# Patient Record
Sex: Male | Born: 1960 | Race: Black or African American | Hispanic: No | Marital: Single | Smoking: Current every day smoker
Health system: Southern US, Community
[De-identification: ages and names within clinical notes are randomized; demographics above are authoritative.]

## PROBLEM LIST (undated history)

## (undated) HISTORY — PX: APPENDECTOMY: SHX54

---

## 2003-05-11 ENCOUNTER — Emergency Department (HOSPITAL_COMMUNITY): Admission: EM | Admit: 2003-05-11 | Discharge: 2003-05-12 | Payer: Self-pay | Admitting: Emergency Medicine

## 2004-03-31 ENCOUNTER — Emergency Department (HOSPITAL_COMMUNITY): Admission: EM | Admit: 2004-03-31 | Discharge: 2004-03-31 | Payer: Self-pay | Admitting: *Deleted

## 2004-06-04 ENCOUNTER — Ambulatory Visit (HOSPITAL_COMMUNITY): Admission: RE | Admit: 2004-06-04 | Discharge: 2004-06-04 | Payer: Self-pay | Admitting: *Deleted

## 2004-10-30 ENCOUNTER — Ambulatory Visit (HOSPITAL_COMMUNITY): Admission: RE | Admit: 2004-10-30 | Discharge: 2004-10-30 | Payer: Self-pay | Admitting: *Deleted

## 2005-03-27 ENCOUNTER — Emergency Department (HOSPITAL_COMMUNITY): Admission: EM | Admit: 2005-03-27 | Discharge: 2005-03-27 | Payer: Self-pay | Admitting: Emergency Medicine

## 2006-05-19 ENCOUNTER — Emergency Department (HOSPITAL_COMMUNITY): Admission: EM | Admit: 2006-05-19 | Discharge: 2006-05-19 | Payer: Self-pay | Admitting: Emergency Medicine

## 2010-09-12 ENCOUNTER — Encounter: Payer: Self-pay | Admitting: *Deleted

## 2010-10-16 ENCOUNTER — Emergency Department (HOSPITAL_COMMUNITY): Payer: BC Managed Care – PPO

## 2010-10-16 ENCOUNTER — Emergency Department (HOSPITAL_COMMUNITY)
Admission: EM | Admit: 2010-10-16 | Discharge: 2010-10-16 | Disposition: A | Discharge status: Home / Self Care | Payer: BC Managed Care – PPO | Attending: Emergency Medicine | Admitting: Emergency Medicine

## 2010-10-16 DIAGNOSIS — F17200 Nicotine dependence, unspecified, uncomplicated: Secondary | ICD-10-CM | POA: Insufficient documentation

## 2010-10-16 DIAGNOSIS — M542 Cervicalgia: Secondary | ICD-10-CM | POA: Insufficient documentation

## 2010-10-16 DIAGNOSIS — M62838 Other muscle spasm: Secondary | ICD-10-CM | POA: Insufficient documentation

## 2010-10-16 DIAGNOSIS — S199XXA Unspecified injury of neck, initial encounter: Secondary | ICD-10-CM | POA: Insufficient documentation

## 2010-10-16 DIAGNOSIS — W219XXA Striking against or struck by unspecified sports equipment, initial encounter: Secondary | ICD-10-CM | POA: Insufficient documentation

## 2010-10-16 DIAGNOSIS — M25519 Pain in unspecified shoulder: Secondary | ICD-10-CM | POA: Insufficient documentation

## 2010-10-16 DIAGNOSIS — S0993XA Unspecified injury of face, initial encounter: Secondary | ICD-10-CM | POA: Insufficient documentation

## 2010-10-16 DIAGNOSIS — M47812 Spondylosis without myelopathy or radiculopathy, cervical region: Secondary | ICD-10-CM | POA: Insufficient documentation

## 2010-10-16 DIAGNOSIS — Y929 Unspecified place or not applicable: Secondary | ICD-10-CM | POA: Insufficient documentation

## 2010-10-16 DIAGNOSIS — Y9367 Activity, basketball: Secondary | ICD-10-CM | POA: Insufficient documentation

## 2012-07-23 ENCOUNTER — Emergency Department (HOSPITAL_COMMUNITY)
Admission: EM | Admit: 2012-07-23 | Discharge: 2012-07-23 | Discharge status: Against Medical Advice | Payer: Self-pay | Attending: Emergency Medicine | Admitting: Emergency Medicine

## 2012-07-23 ENCOUNTER — Encounter (HOSPITAL_COMMUNITY): Payer: Self-pay | Admitting: *Deleted

## 2012-07-23 ENCOUNTER — Emergency Department (INDEPENDENT_AMBULATORY_CARE_PROVIDER_SITE_OTHER)
Admission: EM | Admit: 2012-07-23 | Discharge: 2012-07-23 | Disposition: A | Discharge status: Nursing Home (Includes ICF) | Payer: Self-pay | Source: Home / Self Care | Attending: Family Medicine | Admitting: Family Medicine

## 2012-07-23 ENCOUNTER — Encounter (HOSPITAL_COMMUNITY): Payer: Self-pay | Admitting: Emergency Medicine

## 2012-07-23 DIAGNOSIS — X58XXXA Exposure to other specified factors, initial encounter: Secondary | ICD-10-CM | POA: Insufficient documentation

## 2012-07-23 DIAGNOSIS — R410 Disorientation, unspecified: Secondary | ICD-10-CM

## 2012-07-23 DIAGNOSIS — M545 Low back pain: Secondary | ICD-10-CM

## 2012-07-23 DIAGNOSIS — Y929 Unspecified place or not applicable: Secondary | ICD-10-CM | POA: Insufficient documentation

## 2012-07-23 DIAGNOSIS — Y939 Activity, unspecified: Secondary | ICD-10-CM | POA: Insufficient documentation

## 2012-07-23 DIAGNOSIS — S0993XA Unspecified injury of face, initial encounter: Secondary | ICD-10-CM | POA: Insufficient documentation

## 2012-07-23 DIAGNOSIS — F29 Unspecified psychosis not due to a substance or known physiological condition: Secondary | ICD-10-CM

## 2012-07-23 DIAGNOSIS — IMO0002 Reserved for concepts with insufficient information to code with codable children: Secondary | ICD-10-CM | POA: Insufficient documentation

## 2012-07-23 DIAGNOSIS — S0990XA Unspecified injury of head, initial encounter: Secondary | ICD-10-CM

## 2012-07-23 NOTE — ED Notes (Signed)
Patient left AMA immediately after being informed that he would need to wait for the physician to assess him prior to any pain medications being given. Patient refused teaching and refused to sign AMA paperwork

## 2012-07-23 NOTE — ED Notes (Signed)
Pt reports falling this weekend and having lower back and neck pain. Ambulatory at triage.

## 2012-07-23 NOTE — ED Notes (Signed)
Pt wheeled back into lobby in a wheelchair, pt and friend then became angry that friend was not allowed to come into triage area with him. Friend states that pt is disoriented and unable to answer questions. Pt answered all triage questions appropriately and did not seem confused. Informed pt that our policy is no visitors in triage, they became upset and left after being triaged.
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
## 2012-07-23 NOTE — ED Provider Notes (Signed)
History     CSN: [id number]  Arrival date & time 07/23/12  1409   None     Chief Complaint  Patient presents with  . Back Pain    fall on 11/29 hit back and head.    (Consider location/radiation/quality/duration/timing/severity/associated sxs/prior treatment) HPI Comments: Pt slipped and fell on marble floor [place], hitting lower back and head.  Denies LOC, but brother reports pt was confused the night of injury and is still having confusion/memory problems.  Pt here because back pain is severe; brother is concerned with pt's behavior ("he's not right").  Pt was [place] in ER [place] of state; pt thinks only received xrays, head was not checked, but he is not sure.   Patient is a 51 y.o. male presenting with head injury. The history is provided by the patient and a relative.  Head Injury  The incident occurred more than 2 days ago. He came to the ER via walk-in. The injury mechanism was a fall. There was no loss of consciousness. There was no blood loss. The pain is severe. The pain has been worsening since the injury. Associated symptoms include numbness, blurred vision and disorientation. Pertinent negatives include no vomiting. He has tried prescription drugs for the symptoms. The treatment provided no relief.    History reviewed. No pertinent past medical history.  History reviewed. No pertinent past surgical history.  History reviewed. No pertinent family history.  History  Substance Use Topics  . Smoking status: Current Every Day Smoker -- 0.5 packs/day    Types: Cigarettes  . Smokeless tobacco: Not on file  . Alcohol Use: No      Review of Systems  HENT: Positive for neck pain.   Eyes: Positive for blurred vision and visual disturbance.  Gastrointestinal: Negative for vomiting.  Musculoskeletal: Positive for back pain.  Skin: Negative for wound.  Neurological: Positive for dizziness, numbness and headaches.    Allergies  Review of patient's  allergies indicates no known allergies.  Home Medications  No current outpatient prescriptions on file.  BP 121/81  Pulse 66  Temp 97.7 F (36.5 C) (Oral)  Resp 12  SpO2 100%  Physical Exam  Constitutional: He is oriented to person, place, and time. He appears well-developed and well-nourished.       Appears uncomfortable.   Cardiovascular: Normal rate and regular rhythm.   Pulmonary/Chest: Effort normal and breath sounds normal.  Musculoskeletal:       Lumbar back: He exhibits tenderness and bony tenderness.       Radicular pain down LLE when palpate lumbar spine.   Neurological: He is alert and oriented to person, place, and time.       Brother reports pt's memory not accurate    ED Course  Procedures (including critical care time)  Labs Reviewed - No data to display No results found.   1. Head injury   2. Confusion   3. Lumbar pain with radiation down left leg       MDM  Discussed with Dr. Alfonse Ras.  As I cannot be certain pt had his head injury evaluated at outside hospital, pt transferred to ER here for further eval.         Cathlyn Parsons, NP 07/23/12 1654

## 2012-07-25 NOTE — ED Provider Notes (Signed)
Medical screening examination/treatment/procedure(s) were performed by non-physician practitioner and as supervising physician I was immediately available for consultation/collaboration.   MORENO-COLL,Lilia Letterman; MD   Knox Cervi Moreno-Coll, MD 07/25/12 0842 

## 2012-07-26 ENCOUNTER — Emergency Department (HOSPITAL_COMMUNITY)
Admission: EM | Admit: 2012-07-26 | Discharge: 2012-07-26 | Disposition: A | Discharge status: Home / Self Care | Payer: Self-pay | Attending: Emergency Medicine | Admitting: Emergency Medicine

## 2012-07-26 ENCOUNTER — Emergency Department (HOSPITAL_COMMUNITY): Payer: Self-pay

## 2012-07-26 ENCOUNTER — Encounter (HOSPITAL_COMMUNITY): Payer: Self-pay | Admitting: *Deleted

## 2012-07-26 DIAGNOSIS — Y939 Activity, unspecified: Secondary | ICD-10-CM | POA: Insufficient documentation

## 2012-07-26 DIAGNOSIS — Z79899 Other long term (current) drug therapy: Secondary | ICD-10-CM | POA: Insufficient documentation

## 2012-07-26 DIAGNOSIS — R296 Repeated falls: Secondary | ICD-10-CM | POA: Insufficient documentation

## 2012-07-26 DIAGNOSIS — Y9229 Other specified public building as the place of occurrence of the external cause: Secondary | ICD-10-CM | POA: Insufficient documentation

## 2012-07-26 DIAGNOSIS — G44309 Post-traumatic headache, unspecified, not intractable: Secondary | ICD-10-CM | POA: Insufficient documentation

## 2012-07-26 DIAGNOSIS — W19XXXA Unspecified fall, initial encounter: Secondary | ICD-10-CM

## 2012-07-26 DIAGNOSIS — F17200 Nicotine dependence, unspecified, uncomplicated: Secondary | ICD-10-CM | POA: Insufficient documentation

## 2012-07-26 DIAGNOSIS — S0990XA Unspecified injury of head, initial encounter: Secondary | ICD-10-CM | POA: Insufficient documentation

## 2012-07-26 MED ORDER — OXYCODONE-ACETAMINOPHEN 5-325 MG PO TABS
1.0000 | ORAL_TABLET | Freq: Once | ORAL | Status: AC
  Administered 2012-07-26: 1 via ORAL
  Filled 2012-07-26: qty 1

## 2012-07-26 MED ORDER — IBUPROFEN 800 MG PO TABS
800.0000 mg | ORAL_TABLET | Freq: Once | ORAL | Status: AC
  Administered 2012-07-26: 800 mg via ORAL
  Filled 2012-07-26: qty 1

## 2012-07-26 MED ORDER — IBUPROFEN 800 MG PO TABS: 800.0000 mg | ORAL_TABLET | Freq: Three times a day (TID) | ORAL | Status: DC

## 2012-07-26 MED ORDER — ONDANSETRON 4 MG PO TBDP
4.0000 mg | ORAL_TABLET | Freq: Once | ORAL | Status: AC
  Administered 2012-07-26: 4 mg via ORAL
  Filled 2012-07-26: qty 1

## 2012-07-26 MED ORDER — HYDROCODONE-ACETAMINOPHEN 5-500 MG PO TABS: 1.0000 | ORAL_TABLET | Freq: Four times a day (QID) | ORAL | Status: DC | PRN

## 2012-07-26 NOTE — ED Provider Notes (Signed)
History     CSN: [id number]  Arrival date & time 07/26/12  4098   First MD Initiated Contact with Patient 07/26/12 [phone]      Chief Complaint  Patient presents with  . Headache    (Consider location/radiation/quality/duration/timing/severity/associated sxs/prior treatment) HPI  Pt presents to the ED for evaluation of his head from a fall on 07/20/2012. He was seen in a hospital in [place] the same night as the fall but they did not evaluate his head, or he is unsure. He feels as though since then he has had a headache and has been slightly confused. He went to the Urgent Care two days ago for further evaluation and they sent him to the ED for Head CT due to possible head trauma. He left AMA because the wait was too long. He is back today for evaluation. He says his head still hurts and he is having trouble remembering things. He is also having residual hip pain but he had xrays already and they were negative. He is not having any trouble walking. He tells me that he came " To get the Head CT the urgent care sent me over for". nad vss  History reviewed. No pertinent past medical history.  History reviewed. No pertinent past surgical history.  History reviewed. No pertinent family history.  History  Substance Use Topics  . Smoking status: Current Every Day Smoker -- 0.5 packs/day    Types: Cigarettes  . Smokeless tobacco: Not on file  . Alcohol Use: Yes      Review of Systems  Review of Systems  Gen: no weight loss, fevers, chills, night sweats  Lungs:No wheezing, coughing or hemoptysis CV: no chest pain, palpitations, dependent edema or orthopnea  Abd: no abdominal pain, nausea, vomiting  GU: no dysuria or gross hematuria  MSK:  No abnormalities  Neuro: + headache with trouble remembering things,  no focal neurologic deficits  Skin: no abnormalities Psyche: negative.   Allergies  Review of patient's allergies indicates no known allergies.  Home Medications    Current Outpatient Rx  Name  Route  Sig  Dispense  Refill  . BC HEADACHE POWDER PO   Oral   Take 1 packet by mouth daily as needed. For pain         . IBUPROFEN 200 MG PO TABS   Oral   Take 600 mg by mouth every 6 (six) hours as needed. For pain         . NAPROXEN 500 MG PO TABS   Oral   Take 500 mg by mouth 2 (two) times daily with a meal.         . OXYCODONE-ACETAMINOPHEN 5-325 MG PO TABS   Oral   Take 1 tablet by mouth every 4 (four) hours as needed. For pain           BP 112/78  Pulse 60  Temp 98.2 F (36.8 C)  Resp 18  SpO2 100%  Physical Exam  Nursing note and vitals reviewed. Constitutional: He is oriented to person, place, and time. He appears well-developed and well-nourished. No distress.  HENT:  Head: Normocephalic and atraumatic. Head is without raccoon's eyes, without Battle's sign, without abrasion, without contusion, without laceration, without right periorbital erythema and without left periorbital erythema.  Nose: Nose normal.  Mouth/Throat: Oropharynx is clear and moist and mucous membranes are normal.  Eyes: Pupils are equal, round, and reactive to light.  Neck: Normal range of motion. Neck supple.  Cardiovascular: Normal  rate and regular rhythm.   Pulmonary/Chest: Effort normal.  Abdominal: Soft.  Neurological: He is alert and oriented to person, place, and time. He has normal strength. No cranial nerve deficit or sensory deficit. GCS eye subscore is 4. GCS verbal subscore is 5. GCS motor subscore is 6.  Skin: Skin is warm and dry.    ED Course  Procedures (including critical care time)  Labs Reviewed - No data to display Ct Head Wo Contrast  07/26/2012  *RADIOLOGY REPORT*  Clinical Data: Fall 5 days ago.  Headache and dizziness.  CT HEAD WITHOUT CONTRAST  Technique:  Contiguous axial images were obtained from the base of the skull through the vertex without contrast.  Comparison: None.  Findings: No acute intracranial abnormality is  present. Specifically, there is no evidence for acute infarct, hemorrhage, mass, hydrocephalus, or extra-axial fluid collection.  The paranasal sinuses and mastoid air cells are clear.  The globes and orbits are intact.  The osseous skull is intact.  IMPRESSION: Negative CT of the head.   Original Report Authenticated By: Marin Roberts, M.D.      No diagnosis found. Dx: fall       Head injury   MDM  Normal head CT. Pt given Percocet and Ibuprofen in the ED with resolution of pain. Will refill his meds and give referral to Ortho in case he continues to have muscle aches from his fall.  Pt has been advised of the symptoms that warrant their return to the ED. Patient has voiced understanding and has agreed to follow-up with the PCP or specialist.         Dorthula Matas, PA 07/26/12 [phone]

## 2012-07-26 NOTE — ED Notes (Signed)
Returns from CT 
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
## 2012-07-29 NOTE — ED Provider Notes (Signed)
Medical screening examination/treatment/procedure(s) were performed by non-physician practitioner and as supervising physician I was immediately available for consultation/collaboration.  Brittiany Wiehe T Datrell Dunton, MD 07/29/12 0718 

## 2012-08-08 ENCOUNTER — Ambulatory Visit: Payer: Self-pay | Attending: General Practice | Admitting: Physical Therapy

## 2012-08-08 DIAGNOSIS — IMO0001 Reserved for inherently not codable concepts without codable children: Secondary | ICD-10-CM | POA: Insufficient documentation

## 2012-08-08 DIAGNOSIS — M545 Low back pain, unspecified: Secondary | ICD-10-CM | POA: Insufficient documentation

## 2012-08-08 DIAGNOSIS — M542 Cervicalgia: Secondary | ICD-10-CM | POA: Insufficient documentation

## 2012-08-21 ENCOUNTER — Ambulatory Visit: Payer: Self-pay | Admitting: Physical Therapy

## 2012-08-24 ENCOUNTER — Ambulatory Visit: Payer: Self-pay | Attending: General Practice | Admitting: Physical Therapy

## 2012-08-24 DIAGNOSIS — M542 Cervicalgia: Secondary | ICD-10-CM | POA: Insufficient documentation

## 2012-08-24 DIAGNOSIS — IMO0001 Reserved for inherently not codable concepts without codable children: Secondary | ICD-10-CM | POA: Insufficient documentation

## 2012-08-24 DIAGNOSIS — M545 Low back pain, unspecified: Secondary | ICD-10-CM | POA: Insufficient documentation

## 2012-08-28 ENCOUNTER — Ambulatory Visit: Payer: Self-pay | Admitting: Physical Therapy

## 2012-08-30 ENCOUNTER — Ambulatory Visit: Payer: Self-pay | Admitting: Physical Therapy

## 2012-09-04 ENCOUNTER — Ambulatory Visit: Payer: Self-pay | Admitting: Physical Therapy

## 2012-09-06 ENCOUNTER — Ambulatory Visit: Payer: Self-pay | Admitting: Physical Therapy

## 2012-09-11 ENCOUNTER — Ambulatory Visit: Payer: Self-pay | Admitting: Physical Therapy

## 2012-09-14 ENCOUNTER — Ambulatory Visit: Payer: Self-pay | Admitting: Physical Therapy

## 2013-08-14 ENCOUNTER — Emergency Department (HOSPITAL_COMMUNITY)
Admission: EM | Admit: 2013-08-14 | Discharge: 2013-08-14 | Disposition: A | Discharge status: Home / Self Care | Payer: No Typology Code available for payment source | Attending: Emergency Medicine | Admitting: Emergency Medicine

## 2013-08-14 ENCOUNTER — Encounter (HOSPITAL_COMMUNITY): Payer: Self-pay | Admitting: Emergency Medicine

## 2013-08-14 DIAGNOSIS — M549 Dorsalgia, unspecified: Secondary | ICD-10-CM

## 2013-08-14 DIAGNOSIS — S0993XA Unspecified injury of face, initial encounter: Secondary | ICD-10-CM | POA: Insufficient documentation

## 2013-08-14 DIAGNOSIS — F17200 Nicotine dependence, unspecified, uncomplicated: Secondary | ICD-10-CM | POA: Insufficient documentation

## 2013-08-14 DIAGNOSIS — M542 Cervicalgia: Secondary | ICD-10-CM

## 2013-08-14 DIAGNOSIS — Y92410 Unspecified street and highway as the place of occurrence of the external cause: Secondary | ICD-10-CM | POA: Insufficient documentation

## 2013-08-14 DIAGNOSIS — IMO0002 Reserved for concepts with insufficient information to code with codable children: Secondary | ICD-10-CM | POA: Insufficient documentation

## 2013-08-14 DIAGNOSIS — Y9389 Activity, other specified: Secondary | ICD-10-CM | POA: Insufficient documentation

## 2013-08-14 MED ORDER — OXYCODONE-ACETAMINOPHEN 5-325 MG PO TABS: 1.0000 | ORAL_TABLET | ORAL | Status: DC | PRN

## 2013-08-14 MED ORDER — METHOCARBAMOL 500 MG PO TABS: 500.0000 mg | ORAL_TABLET | Freq: Two times a day (BID) | ORAL | Status: DC | PRN
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
## 2013-08-14 NOTE — ED Provider Notes (Signed)
Medical screening examination/treatment/procedure(s) were performed by non-physician practitioner and as supervising physician I was immediately available for consultation/collaboration.  EKG Interpretation   None         Audree Camel, MD 08/14/13 [phone]
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
## 2018-06-28 ENCOUNTER — Ambulatory Visit: Payer: Self-pay | Admitting: Family Medicine

## 2018-07-04 ENCOUNTER — Encounter: Payer: Self-pay | Admitting: Family Medicine

## 2018-07-04 ENCOUNTER — Ambulatory Visit (INDEPENDENT_AMBULATORY_CARE_PROVIDER_SITE_OTHER): Payer: Self-pay | Admitting: Family Medicine

## 2018-07-04 VITALS — BP 155/88 | HR 78 | Temp 98.4°F | Resp 17 | Ht 70.5 in | Wt 182.8 lb

## 2018-07-04 DIAGNOSIS — I10 Essential (primary) hypertension: Secondary | ICD-10-CM

## 2018-07-04 DIAGNOSIS — Z7689 Persons encountering health services in other specified circumstances: Secondary | ICD-10-CM

## 2018-07-04 DIAGNOSIS — Z131 Encounter for screening for diabetes mellitus: Secondary | ICD-10-CM

## 2018-07-04 DIAGNOSIS — M5442 Lumbago with sciatica, left side: Secondary | ICD-10-CM

## 2018-07-04 DIAGNOSIS — M5441 Lumbago with sciatica, right side: Secondary | ICD-10-CM

## 2018-07-04 DIAGNOSIS — Z130 Encounter for screening for diseases of the blood and blood-forming organs and certain disorders involving the immune mechanism: Secondary | ICD-10-CM

## 2018-07-04 DIAGNOSIS — Z125 Encounter for screening for malignant neoplasm of prostate: Secondary | ICD-10-CM

## 2018-07-04 MED ORDER — ACETAMINOPHEN 500 MG PO CAPS: 500.0000 mg | ORAL_CAPSULE | Freq: Four times a day (QID) | ORAL | 0 refills | Status: DC | PRN

## 2018-07-04 MED ORDER — CYCLOBENZAPRINE HCL 10 MG PO TABS: 10.0000 mg | ORAL_TABLET | Freq: Two times a day (BID) | ORAL | 1 refills | Status: DC | PRN

## 2018-07-04 MED ORDER — HYDROCHLOROTHIAZIDE 25 MG PO TABS: 25.0000 mg | ORAL_TABLET | Freq: Every day | ORAL | 1 refills | Status: DC

## 2018-07-04 NOTE — Progress Notes (Signed)
Manuel Clark, is a 57 y.o. male  ZOX:[id number]  WJX:[id number]  DOB - 1961/07/10  CC:  Chief Complaint  Patient presents with  . Establish Care  . Back Pain    previous back injury in the [employer]. lower back pain. taking OTC ibuprofen with mild relief       HPI: Manuel Clark is a 57 y.o. male is here today to establish care.   Manuel Clark does not have a problem list on file.   Today's visit:  Elevated Blood Pressure No known history of hypertension. No routine preventive health care. He is long-time smoker and averages 1 pack per day. He denies chest pain, SOB, headaches, dizziness, or weakness. Back pain  Back pain over 7 years. He has been taking twelve  ibuprofen. No numbness in lower leg. Complains of left leg weakness. No recent imaging.  He suffered an injury while playing basketball several years in which he jump and twist his back and he has experienced back intermittently since that time.  Rash Inner left hand and finger, dry, and itching, chronic calcifies. This has been present for over the last 7 years Skin drying and calcifies. Tries lotions and creams.   Patient denies new headaches, chest pain, abdominal pain, nausea, new weakness , numbness or tingling, SOB, edema, or worrisome cough.  Current medications:No current outpatient medications on file.   Pertinent family medical history: Father-Diabetes and Mother-Hypertension  No Known Allergies Social History   Socioeconomic History  . Marital status: Single    Spouse name: Not on file  . Number of children: Not on file  . Years of education: Not on file  . Highest education level: Not on file  Occupational History  . Not on file  Social Needs  . Financial resource strain: Not on file  . Food insecurity:    Worry: Not on file    Inability: Not on file  . Transportation needs:    Medical: Not on file    Non-medical: Not on file  Tobacco Use  . Smoking status: Current Every Day Smoker    Packs/day:  0.50    Types: Cigarettes  . Smokeless tobacco: Never Used  Substance and Sexual Activity  . Alcohol use: Yes  . Drug use: No  . Sexual activity: Not on file  Lifestyle  . Physical activity:    Days per week: Not on file    Minutes per session: Not on file  . Stress: Not on file  Relationships  . Social connections:    Talks on phone: Not on file    Gets together: Not on file    Attends religious service: Not on file    Active member of club or organization: Not on file    Attends meetings of clubs or organizations: Not on file    Relationship status: Not on file  . Intimate partner violence:    Fear of current or ex partner: Not on file    Emotionally abused: Not on file    Physically abused: Not on file    Forced sexual activity: Not on file  Other Topics Concern  . Not on file  Social History Narrative  . Not on file    Review of Systems: Constitutional: Negative for fever, chills, diaphoresis, activity change, appetite change and fatigue. Respiratory: Negative for cough, choking, chest tightness, shortness of breath, wheezing and stridor.  Cardiovascular: Negative for chest pain, palpitations and leg swelling. Musculoskeletal: Positive for back pain. Neurological: Negative for dizziness, tremors,  seizures, syncope, facial asymmetry, speech difficulty, weakness, light-headedness, numbness and headaches.  Hematological: Negative for adenopathy. Does not bruise/bleed easily. Psychiatric/Behavioral: Negative for hallucinations, behavioral problems, confusion, dysphoric mood, decreased concentration and agitation.    Objective:   Vitals:   07/04/18 0937  BP: (!) 155/88  Pulse: 78  Resp: 17  Temp: 98.4 F (36.9 C)  SpO2: 94%    BP Readings from Last 3 Encounters:  07/04/18 (!) 155/88  08/14/13 135/77  07/26/12 112/78    Filed Weights   07/04/18 0937  Weight: 182 lb 12.8 oz (82.9 kg)      Physical Exam: Constitutional: Patient appears well-developed and  well-nourished. No distress. HENT: Normocephalic, atraumatic, External right and left ear normal. Eyes: Conjunctivae and EOM are normal. PERRLA, no scleral icterus. Neck: Normal ROM. Neck supple. No JVD. No tracheal deviation. CVS: RRR, S1/S2 +, no murmurs, no gallops, no carotid bruit.  Pulmonary: Effort and breath sounds normal, no stridor, rhonchi, wheezes, rales.  Abdominal: Soft. BS +, no distension, tenderness, rebound or guarding.  Musculoskeletal: Normal range of motion. No edema and no tenderness. Negative SLR bilaterally, + 2 patellar reflexes Neuro: Alert. Normal muscle tone coordination. Normal gait. BUE and BLE strength 5/5. Bilateral hand grips symmetrical. Skin: Skin is warm and dry. No rash noted. Not diaphoretic. No erythema. No pallor. Psychiatric: Normal mood and affect. Behavior, judgment, thought content normal.     Assessment and plan:  1. Encounter to establish care 2. Bilateral low back pain with bilateral sciatica, unspecified chronicity - DG Lumbar Spine Complete; Future, if abnormal will refer to orthopedics -Discontinue ibuprofen  -Take acetaminophen 500 mg every 6 hours as needed for pain  -Cyclobenzaprine 10 mg twice daily as needed  3. Essential hypertension, new  Elevated x 3 readings in office Start hydrochlorothiazide 25 mg once daily  We have discussed target BP range and blood pressure goal. I have advised patient to check BP regularly and to call us back or report to clinic if the numbers are consistently higher than 140/90. We discussed the importance of compliance with medical therapy and DASH diet recommended, consequences of uncontrolled hypertension discussed  Checking  Thyroid Panel With TSH  4. Screening for diabetes mellitus - Comprehensive metabolic panel - Hemoglobin A1c; Future   5. Screening for deficiency anemia - CBC with Differential   Meds ordered this encounter  Medications  . hydrochlorothiazide (HYDRODIURIL) 25 MG tablet     Sig: Take 1 tablet (25 mg total) by mouth daily.    Dispense:  30 tablet    Refill:  1  . cyclobenzaprine (FLEXERIL) 10 MG tablet    Sig: Take 1 tablet (10 mg total) by mouth 2 (two) times daily as needed for muscle spasms.    Dispense:  60 tablet    Refill:  1  . Acetaminophen 500 MG coapsule    Sig: Take 1 capsule (500 mg total) by mouth every 6 (six) hours as needed for fever.    Dispense:  30 capsule    Refill:  0    A total of 40 minutes spent, greater than 50 % of this time was spent counseling and coordination of care.   Return for 2 weeks  BP check and fasting lipid panel make AM appointment .   The patient was given clear instructions to go to ER or return to medical center if symptoms don't improve, worsen or new problems develop. The patient verbalized understanding. The patient was advised  to call and obtain  lab results if they haven't heard anything from out office within 7-10 business days.  Joaquin CourtsKimberly Tashaya Ancrum, FNP [facility] [address], [place] [id number] [phone]: [phone]    This note has been created with Dragon speech recognition software and Paediatric nursesmart phrase technology. Any transcriptional errors are unintentional.

## 2018-07-04 NOTE — Patient Instructions (Addendum)
Thank you for choosing [facility] at [facility] to be your medical home!    Manuel Clark was seen by Joaquin Courts, FNP today.   Margo Aye Picinich's primary care provider is Bing Neighbors, FNP.   For the best care possible, you should try to see Joaquin Courts, FNP-C whenever you come to the clinic.   We look forward to seeing you again soon!  If you have any questions about your visit today, please call us at [phone] or feel free to reach your primary care provider via MyChart.      Go to [facility] Radiology Department to have X-ray completed of you back.  Take medication as prescribed. Avoid Ibuprofen for back pain. You will take acetaminophen (tylenol) instead.    Back Pain, Adult Back pain is very common. The pain often gets better over time. The cause of back pain is usually not dangerous. Most people can learn to manage their back pain on their own. Follow these instructions at home: Watch your back pain for any changes. The following actions may help to lessen any pain you are feeling:  Stay active. Start with short walks on flat ground if you can. Try to walk farther each day.  Exercise regularly as told by your doctor. Exercise helps your back heal faster. It also helps avoid future injury by keeping your muscles strong and flexible.  Do not sit, drive, or stand in one place for more than 30 minutes.  Do not stay in bed. Resting more than 1-2 days can slow down your recovery.  Be careful when you bend or lift an object. Use good form when lifting: ? Bend at your knees. ? Keep the object close to your body. ? Do not twist.  Sleep on a firm mattress. Lie on your side, and bend your knees. If you lie on your back, put a pillow under your knees.  Take medicines only as told by your doctor.  Put ice on the injured area. ? Put ice in a plastic bag. ? Place a towel between your skin and the bag. ? Leave the ice on for 20 minutes, 2-3 times a day for  the first 2-3 days. After that, you can switch between ice and heat packs.  Avoid feeling anxious or stressed. Find good ways to deal with stress, such as exercise.  Maintain a healthy weight. Extra weight puts stress on your back.  Contact a doctor if:  You have pain that does not go away with rest or medicine.  You have worsening pain that goes down into your legs or buttocks.  You have pain that does not get better in one week.  You have pain at night.  You lose weight.  You have a fever or chills. Get help right away if:  You cannot control when you poop (bowel movement) or pee (urinate).  Your arms or legs feel weak.  Your arms or legs lose feeling (numbness).  You feel sick to your stomach (nauseous) or throw up (vomit).  You have belly (abdominal) pain.  You feel like you may pass out (faint). This information is not intended to replace advice given to you by your health care provider. Make sure you discuss any questions you have with your health care provider. Document Released: 01/26/2008 Document Revised: 01/15/2016 Document Reviewed: 12/11/2013 Elsevier Interactive Patient Education  2018 [employer].   Hypertension Hypertension is another name for high blood pressure. High blood pressure forces your heart  to work harder to pump blood. This can cause problems over time. There are two numbers in a blood pressure reading. There is a top number (systolic) over a bottom number (diastolic). It is best to have a blood pressure below 120/80. Healthy choices can help lower your blood pressure. You may need medicine to help lower your blood pressure if:  Your blood pressure cannot be lowered with healthy choices.  Your blood pressure is higher than 130/80.  Follow these instructions at home: Eating and drinking  If directed, follow the DASH eating plan. This diet includes: ? Filling half of your plate at each meal with fruits and vegetables. ? Filling one quarter  of your plate at each meal with whole grains. Whole grains include whole wheat pasta, brown rice, and whole grain bread. ? Eating or drinking low-fat dairy products, such as skim milk or low-fat yogurt. ? Filling one quarter of your plate at each meal with low-fat (lean) proteins. Low-fat proteins include fish, skinless chicken, eggs, beans, and tofu. ? Avoiding fatty meat, cured and processed meat, or chicken with skin. ? Avoiding premade or processed food.  Eat less than 1,500 mg of salt (sodium) a day.  Limit alcohol use to no more than 1 drink a day for nonpregnant women and 2 drinks a day for men. One drink equals 12 oz of beer, 5 oz of wine, or 1 oz of hard liquor. Lifestyle  Work with your doctor to stay at a healthy weight or to lose weight. Ask your doctor what the best weight is for you.  Get at least 30 minutes of exercise that causes your heart to beat faster (aerobic exercise) most days of the week. This may include walking, swimming, or biking.  Get at least 30 minutes of exercise that strengthens your muscles (resistance exercise) at least 3 days a week. This may include lifting weights or pilates.  Do not use any products that contain nicotine or tobacco. This includes cigarettes and e-cigarettes. If you need help quitting, ask your doctor.  Check your blood pressure at home as told by your doctor.  Keep all follow-up visits as told by your doctor. This is important. Medicines  Take over-the-counter and prescription medicines only as told by your doctor. Follow directions carefully.  Do not skip doses of blood pressure medicine. The medicine does not work as well if you skip doses. Skipping doses also puts you at risk for problems.  Ask your doctor about side effects or reactions to medicines that you should watch for. Contact a doctor if:  You think you are having a reaction to the medicine you are taking.  You have headaches that keep coming back  (recurring).  You feel dizzy.  You have swelling in your ankles.  You have trouble with your vision. Get help right away if:  You get a very bad headache.  You start to feel confused.  You feel weak or numb.  You feel faint.  You get very bad pain in your: ? Chest. ? Belly (abdomen).  You throw up (vomit) more than once.  You have trouble breathing. Summary  Hypertension is another name for high blood pressure.  Making healthy choices can help lower blood pressure. If your blood pressure cannot be controlled with healthy choices, you may need to take medicine. This information is not intended to replace advice given to you by your health care provider. Make sure you discuss any questions you have with your health care provider.  Document Released: 01/26/2008 Document Revised: 07/07/2016 Document Reviewed: 07/07/2016 Elsevier Interactive Patient Education  [employer]2018 [employer]vier Inc.

## 2018-07-05 LAB — CBC WITH DIFFERENTIAL/PLATELET
Basophils Absolute: 0 10*3/uL (ref 0.0–0.2)
Basos: 1 %
EOS (ABSOLUTE): 0.2 10*3/uL (ref 0.0–0.4)
Eos: 3 %
Hematocrit: 40.2 % (ref 37.5–51.0)
Hemoglobin: 13.5 g/dL (ref 13.0–17.7)
Immature Grans (Abs): 0 10*3/uL (ref 0.0–0.1)
Immature Granulocytes: 0 %
Lymphocytes Absolute: 2.1 10*3/uL (ref 0.7–3.1)
Lymphs: 31 %
MCH: 26.7 pg (ref 26.6–33.0)
MCHC: 33.6 g/dL (ref 31.5–35.7)
MCV: 79 fL (ref 79–97)
Monocytes Absolute: 0.5 10*3/uL (ref 0.1–0.9)
Monocytes: 7 %
Neutrophils Absolute: 3.9 10*3/uL (ref 1.4–7.0)
Neutrophils: 58 %
Platelets: 328 10*3/uL (ref 150–450)
RBC: 5.06 x10E6/uL (ref 4.14–5.80)
RDW: 15.4 % (ref 12.3–15.4)
WBC: 6.9 10*3/uL (ref 3.4–10.8)

## 2018-07-05 LAB — HEMOGLOBIN A1C
Est. average glucose Bld gHb Est-mCnc: 117 mg/dL
Hgb A1c MFr Bld: 5.7 % — ABNORMAL HIGH (ref 4.8–5.6)

## 2018-07-05 LAB — COMPREHENSIVE METABOLIC PANEL
ALT: 24 IU/L (ref 0–44)
AST: 28 IU/L (ref 0–40)
Albumin/Globulin Ratio: 1.6 (ref 1.2–2.2)
Albumin: 4.4 g/dL (ref 3.5–5.5)
Alkaline Phosphatase: 39 IU/L (ref 39–117)
BUN/Creatinine Ratio: 14 (ref 9–20)
BUN: 15 mg/dL (ref 6–24)
Bilirubin Total: 0.3 mg/dL (ref 0.0–1.2)
CO2: 17 mmol/L — ABNORMAL LOW (ref 20–29)
Calcium: 9.5 mg/dL (ref 8.7–10.2)
Chloride: 109 mmol/L — ABNORMAL HIGH (ref 96–106)
Creatinine, Ser: 1.04 mg/dL (ref 0.76–1.27)
GFR calc Af Amer: 92 mL/min/{1.73_m2} (ref >59)
GFR calc non Af Amer: 80 mL/min/{1.73_m2} (ref >59)
Globulin, Total: 2.8 g/dL (ref 1.5–4.5)
Glucose: 96 mg/dL (ref 65–99)
Potassium: 4.3 mmol/L (ref 3.5–5.2)
Sodium: 144 mmol/L (ref 134–144)
Total Protein: 7.2 g/dL (ref 6.0–8.5)

## 2018-07-05 LAB — THYROID PANEL WITH TSH
Free Thyroxine Index: 1.7 (ref 1.2–4.9)
T3 Uptake Ratio: 25 % (ref 24–39)
T4, Total: 6.8 ug/dL (ref 4.5–12.0)
TSH: 1.37 u[IU]/mL (ref 0.450–4.500)

## 2018-07-06 ENCOUNTER — Ambulatory Visit (HOSPITAL_COMMUNITY)
Admission: RE | Admit: 2018-07-06 | Discharge: 2018-07-06 | Disposition: A | Discharge status: Home / Self Care | Payer: Self-pay | Source: Ambulatory Visit | Attending: Family Medicine | Admitting: Family Medicine

## 2018-07-06 DIAGNOSIS — M5442 Lumbago with sciatica, left side: Secondary | ICD-10-CM

## 2018-07-06 DIAGNOSIS — M5136 Other intervertebral disc degeneration, lumbar region: Secondary | ICD-10-CM | POA: Insufficient documentation

## 2018-07-06 DIAGNOSIS — M5441 Lumbago with sciatica, right side: Secondary | ICD-10-CM

## 2018-07-06 DIAGNOSIS — M48061 Spinal stenosis, lumbar region without neurogenic claudication: Secondary | ICD-10-CM | POA: Insufficient documentation

## 2018-07-07 ENCOUNTER — Telehealth: Payer: Self-pay | Admitting: Family Medicine

## 2018-07-07 NOTE — Telephone Encounter (Signed)
Imaging of back significant for arthritic changes. He will need complete financial assistance and I will then refer him to orthopedics.

## 2018-07-07 NOTE — Telephone Encounter (Signed)
Left voice mail to call back 

## 2018-07-11 ENCOUNTER — Telehealth: Payer: Self-pay | Admitting: Family Medicine

## 2018-07-11 NOTE — Telephone Encounter (Signed)
Patient called please call the patient back when you get a moment

## 2018-07-12 NOTE — Telephone Encounter (Signed)
Left voice mail to call back 

## 2018-07-17 NOTE — Telephone Encounter (Signed)
Patient notified of xray results & recommendations. Expressed understanding. 

## 2018-07-17 NOTE — Progress Notes (Signed)
Patient notified of results & recommendations. Expressed understanding.

## 2018-07-18 ENCOUNTER — Ambulatory Visit (INDEPENDENT_AMBULATORY_CARE_PROVIDER_SITE_OTHER): Payer: Self-pay

## 2018-07-18 VITALS — BP 128/63 | HR 67

## 2018-07-18 DIAGNOSIS — Z125 Encounter for screening for malignant neoplasm of prostate: Secondary | ICD-10-CM

## 2018-07-18 DIAGNOSIS — I10 Essential (primary) hypertension: Secondary | ICD-10-CM

## 2018-07-18 MED ORDER — HYDROCHLOROTHIAZIDE 25 MG PO TABS: 25.0000 mg | ORAL_TABLET | Freq: Every day | ORAL | 1 refills | Status: AC

## 2018-07-18 NOTE — Progress Notes (Signed)
Patient here for BP check. Vitals listed below. Per provider patient is to follow up in 3 months. KWalker, CMA.  Vitals:   07/18/18 1134  BP: 128/63  Pulse: 67

## 2018-07-18 NOTE — Addendum Note (Signed)
Addended by: Heidi DachWALKER, Evora Schechter M on: 07/18/2018 12:02 PM   Modules accepted: Orders

## 2018-07-19 LAB — LIPID PANEL
Chol/HDL Ratio: 3.3 ratio (ref 0.0–5.0)
Cholesterol, Total: 173 mg/dL (ref 100–199)
HDL: 53 mg/dL (ref >39)
LDL Calculated: 102 mg/dL — ABNORMAL HIGH (ref 0–99)
Triglycerides: 92 mg/dL (ref 0–149)
VLDL Cholesterol Cal: 18 mg/dL (ref 5–40)

## 2018-07-19 LAB — PSA: Prostate Specific Ag, Serum: 3.3 ng/mL (ref 0.0–4.0)

## 2018-07-24 ENCOUNTER — Encounter: Payer: Self-pay | Admitting: Family Medicine

## 2018-07-24 NOTE — Progress Notes (Signed)
Mail lab letter  

## 2018-10-18 ENCOUNTER — Ambulatory Visit: Payer: Self-pay | Admitting: Family Medicine

## 2020-02-04 IMAGING — DX DG LUMBAR SPINE COMPLETE 4+V
5 series · 5 of 5 positions shown · non-contrast
Comparison: MRI of the lumbar spine June 04, 2004.

CLINICAL DATA: Low back pain, injury in 7559 while playing
basketball with persistent symptoms.

EXAM:
LUMBAR SPINE - COMPLETE 4+ VIEW

[l-spine ap]
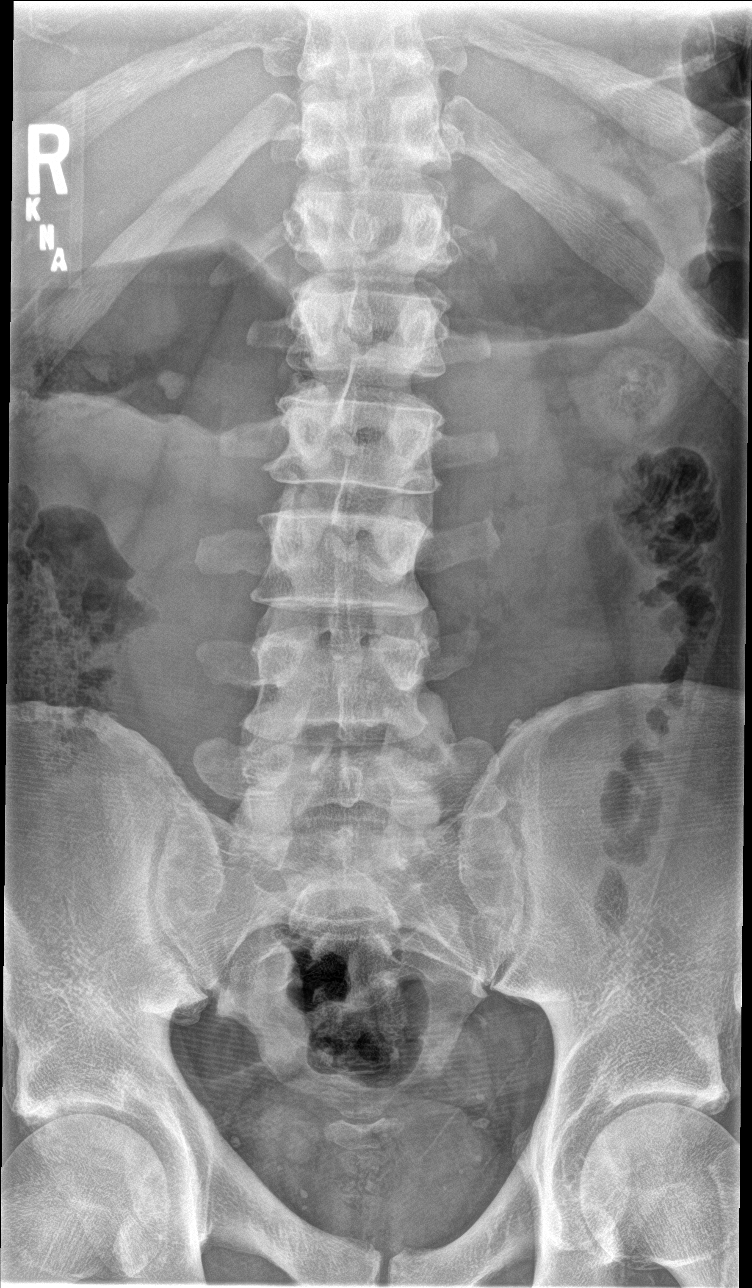

[l-spine obl (1 of 2)]
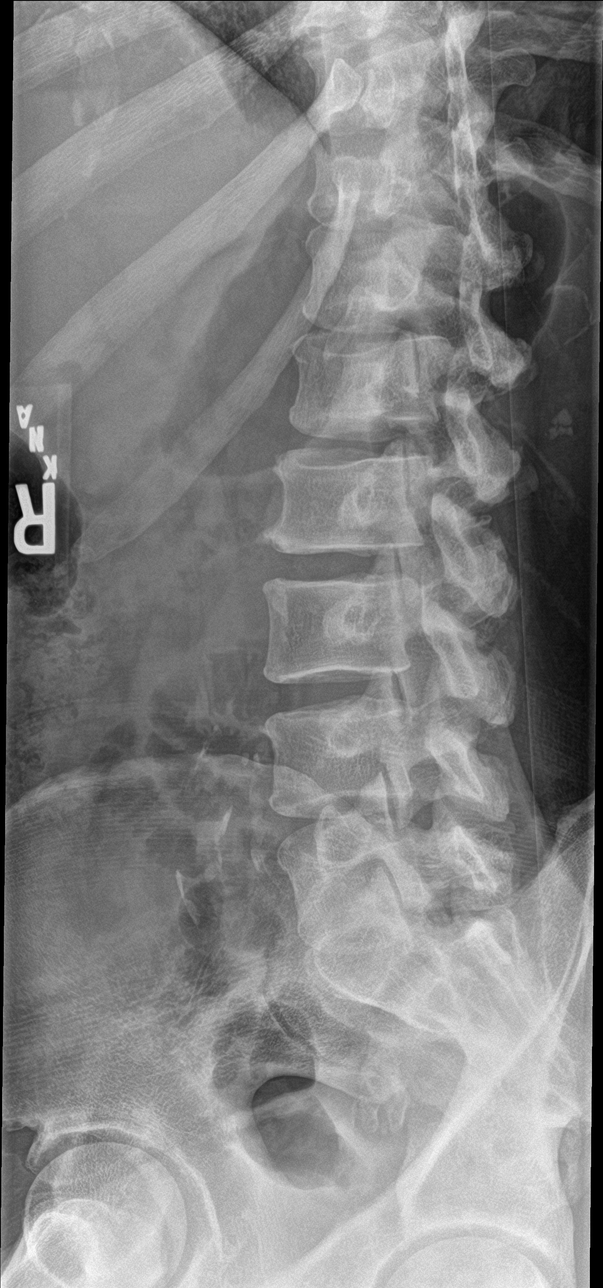

[l-spine obl (2 of 2)]
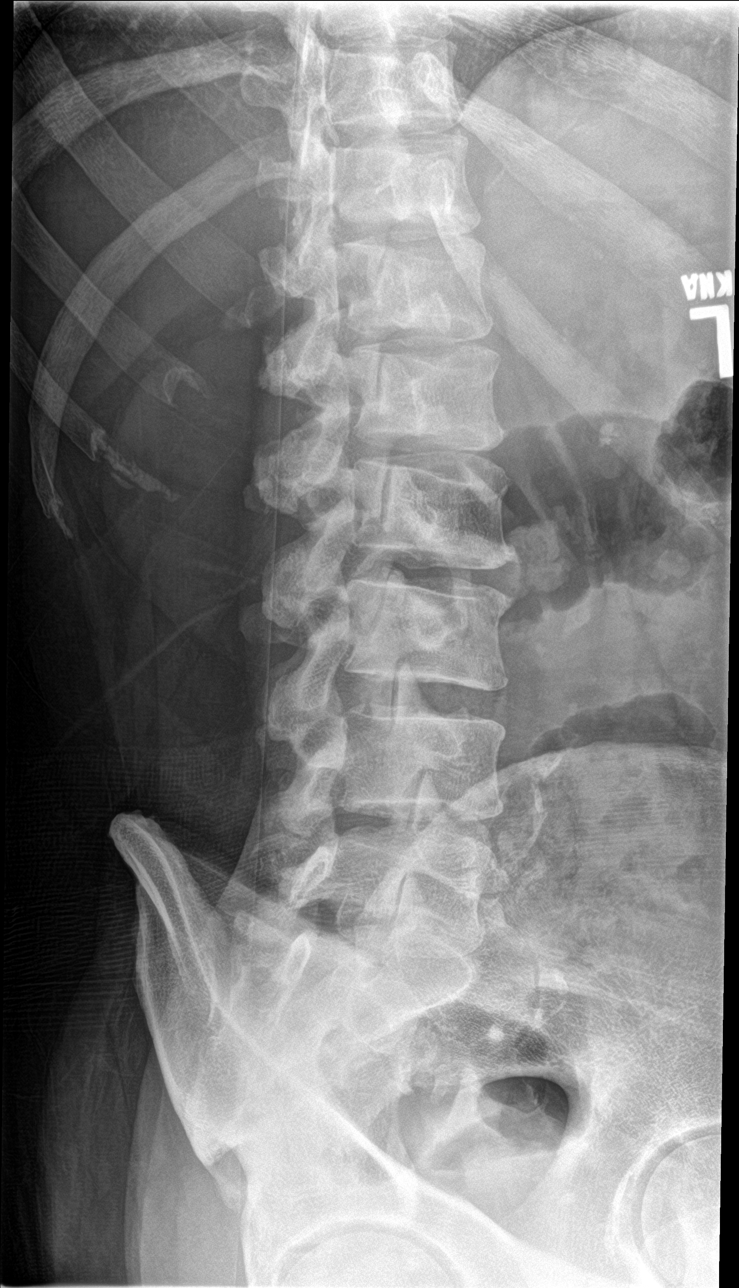

[l-spine lat]
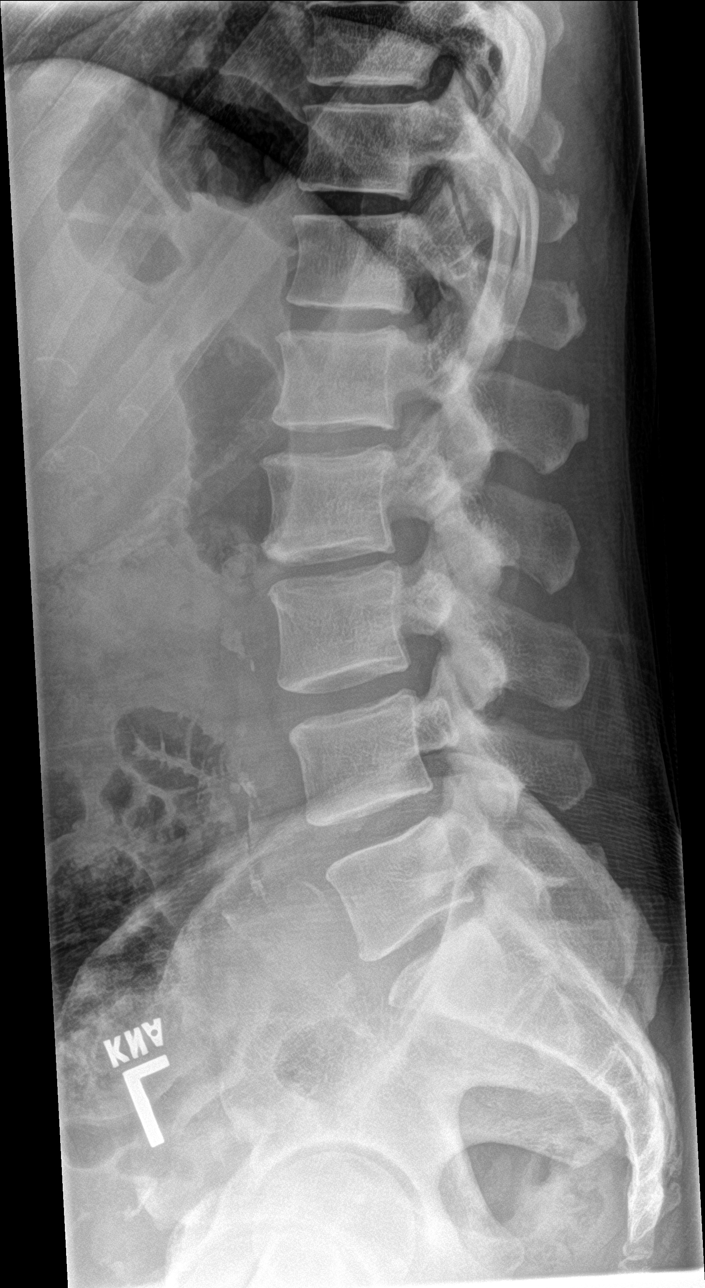

[l-spine spot]
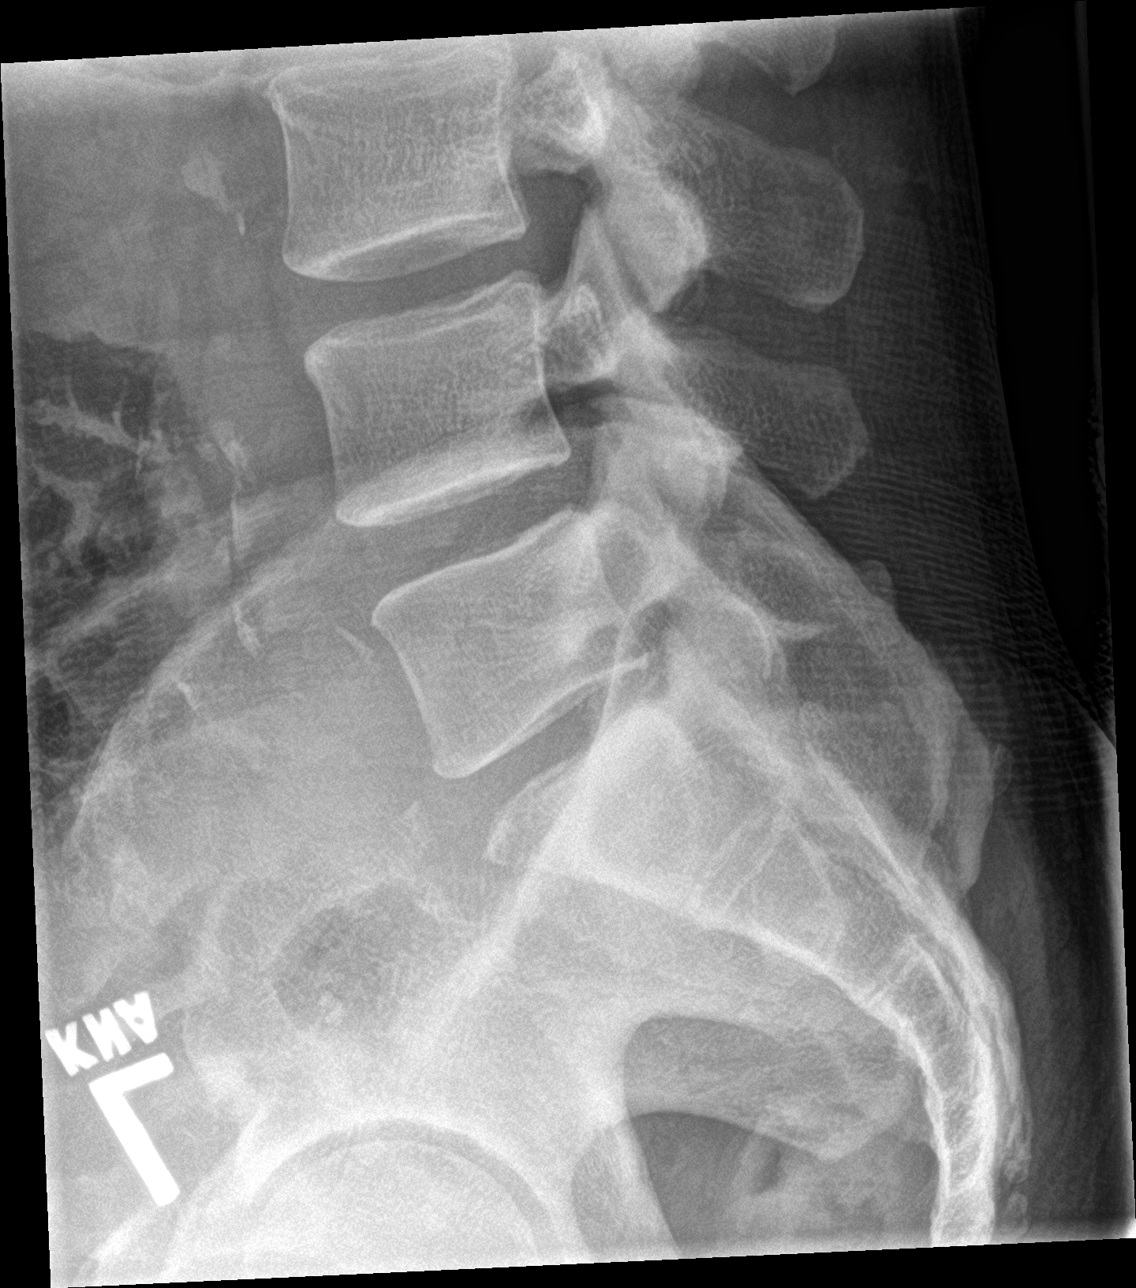

[5 of 5 positions shown; findings below may reference images not displayed]

FINDINGS: The twelfth ribs are assumed to be hypoplastic. The lumbar vertebral
bodies are preserved in height. The pedicles and transverse
processes are intact. There is no spondylolisthesis. The disc space
heights are reasonably well-maintained with exception of minimal
narrowing at L2-3. There is no significant facet joint abnormality.
IMPRESSION: Mild degenerative disc space narrowing at L2-3 which has a. Since
the previous study of 14 years ago. No compression fracture,
spondylolisthesis, nor other acute abnormality.

## 2020-05-10 ENCOUNTER — Encounter (HOSPITAL_COMMUNITY): Payer: Self-pay | Admitting: Emergency Medicine

## 2020-05-10 ENCOUNTER — Other Ambulatory Visit: Payer: Self-pay

## 2020-05-10 ENCOUNTER — Emergency Department (HOSPITAL_COMMUNITY)
Admission: EM | Admit: 2020-05-10 | Discharge: 2020-05-10 | Disposition: A | Discharge status: Home / Self Care | Payer: Self-pay | Attending: Emergency Medicine | Admitting: Emergency Medicine

## 2020-05-10 DIAGNOSIS — F17210 Nicotine dependence, cigarettes, uncomplicated: Secondary | ICD-10-CM | POA: Insufficient documentation

## 2020-05-10 DIAGNOSIS — R252 Cramp and spasm: Secondary | ICD-10-CM | POA: Insufficient documentation

## 2020-05-10 LAB — RAPID URINE DRUG SCREEN, HOSP PERFORMED
Amphetamines: NOT DETECTED
Barbiturates: NOT DETECTED
Benzodiazepines: NOT DETECTED
Cocaine: NOT DETECTED
Opiates: NOT DETECTED
Tetrahydrocannabinol: POSITIVE — AB

## 2020-05-10 LAB — CBC WITH DIFFERENTIAL/PLATELET
Abs Immature Granulocytes: 0.04 10*3/uL (ref 0.00–0.07)
Basophils Absolute: 0.1 10*3/uL (ref 0.0–0.1)
Basophils Relative: 1 %
Eosinophils Absolute: 0.2 10*3/uL (ref 0.0–0.5)
Eosinophils Relative: 2 %
HCT: 44.3 % (ref 39.0–52.0)
Hemoglobin: 14.1 g/dL (ref 13.0–17.0)
Immature Granulocytes: 1 %
Lymphocytes Relative: 30 %
Lymphs Abs: 2.6 10*3/uL (ref 0.7–4.0)
MCH: 26.6 pg (ref 26.0–34.0)
MCHC: 31.8 g/dL (ref 30.0–36.0)
MCV: 83.6 fL (ref 80.0–100.0)
Monocytes Absolute: 0.6 10*3/uL (ref 0.1–1.0)
Monocytes Relative: 7 %
Neutro Abs: 5.2 10*3/uL (ref 1.7–7.7)
Neutrophils Relative %: 59 %
Platelets: 336 10*3/uL (ref 150–400)
RBC: 5.3 MIL/uL (ref 4.22–5.81)
RDW: 14.2 % (ref 11.5–15.5)
WBC: 8.6 10*3/uL (ref 4.0–10.5)
nRBC: 0 % (ref 0.0–0.2)

## 2020-05-10 LAB — COMPREHENSIVE METABOLIC PANEL
ALT: 35 U/L (ref 0–44)
AST: 34 U/L (ref 15–41)
Albumin: 4.2 g/dL (ref 3.5–5.0)
Alkaline Phosphatase: 41 U/L (ref 38–126)
Anion gap: 11 (ref 5–15)
BUN: 18 mg/dL (ref 6–20)
CO2: 21 mmol/L — ABNORMAL LOW (ref 22–32)
Calcium: 9.8 mg/dL (ref 8.9–10.3)
Chloride: 102 mmol/L (ref 98–111)
Creatinine, Ser: 1.49 mg/dL — ABNORMAL HIGH (ref 0.61–1.24)
GFR calc Af Amer: 59 mL/min — ABNORMAL LOW (ref >60)
GFR calc non Af Amer: 51 mL/min — ABNORMAL LOW (ref >60)
Glucose, Bld: 128 mg/dL — ABNORMAL HIGH (ref 70–99)
Potassium: 4.5 mmol/L (ref 3.5–5.1)
Sodium: 134 mmol/L — ABNORMAL LOW (ref 135–145)
Total Bilirubin: 0.6 mg/dL (ref 0.3–1.2)
Total Protein: 7.6 g/dL (ref 6.5–8.1)

## 2020-05-10 LAB — URINALYSIS, ROUTINE W REFLEX MICROSCOPIC
Bacteria, UA: NONE SEEN
Bilirubin Urine: NEGATIVE
Glucose, UA: NEGATIVE mg/dL
Ketones, ur: NEGATIVE mg/dL
Leukocytes,Ua: NEGATIVE
Nitrite: NEGATIVE
Protein, ur: NEGATIVE mg/dL
Specific Gravity, Urine: 1.002 — ABNORMAL LOW (ref 1.005–1.030)
pH: 6 (ref 5.0–8.0)

## 2020-05-10 LAB — CK: Total CK: 291 U/L (ref 49–397)

## 2020-05-10 MED ORDER — SODIUM CHLORIDE 0.9 % IV BOLUS
1000.0000 mL | Freq: Once | INTRAVENOUS | Status: AC
  Administered 2020-05-10: 1000 mL via INTRAVENOUS

## 2020-05-10 MED ORDER — DIAZEPAM 5 MG/ML IJ SOLN
5.0000 mg | Freq: Once | INTRAMUSCULAR | Status: AC
  Administered 2020-05-10: 5 mg via INTRAVENOUS
  Filled 2020-05-10: qty 2

## 2020-05-10 NOTE — Discharge Instructions (Signed)
Stay hydrated and drink plenty of fluids   See your doctor   Return to ER if you have worse cramps, fever.

## 2020-05-10 NOTE — ED Triage Notes (Signed)
Pt to ED with c/o cramping all over his body x's 2 weeks.  Pt denies nausea or vomiting

## 2020-05-10 NOTE — ED Notes (Signed)
Patient verbalizes understanding of discharge instructions. Opportunity for questioning and answers were provided. Armband removed by staff, pt discharged from ED Ambulatory.. ° °

## 2020-05-10 NOTE — ED Provider Notes (Signed)
[facility] EMERGENCY DEPARTMENT Provider Note   CSN: [id number] Arrival date & time: 05/10/20  2003     History Chief Complaint  Patient presents with  . Muscle Cramps    Manuel Clark is a 59 y.o. male who presented with cramping.  Patient states that he has diffuse cramps for the last 2 weeks.  Patient states that the cramps are bilateral legs.  He denies any nausea or vomiting.  He denies any fevers or chills.  He did not receive the Covid vaccine but denies being around with anybody who has Covid.  Patient states that he had something similar previously and required some IV fluids for hydration.  The history is provided by the patient.       History reviewed. No pertinent past medical history.  Patient Active Problem List   Diagnosis Date Noted  . Bilateral low back pain with bilateral sciatica 07/04/2018    Past Surgical History:  Procedure Laterality Date  . APPENDECTOMY         No family history on file.  Social History   Tobacco Use  . Smoking status: Current Every Day Smoker    Packs/day: 0.50    Types: Cigarettes  . Smokeless tobacco: Never Used  Substance Use Topics  . Alcohol use: Yes    Comment: rarely  . Drug use: Yes    Types: Marijuana    Home Medications Prior to Admission medications   Medication Sig Start Date End Date Taking? Authorizing Provider  hydrochlorothiazide (HYDRODIURIL) 25 MG tablet Take 1 tablet (25 mg total) by mouth daily. 07/18/18   Bing Neighbors, FNP    Allergies    Patient has no known allergies.  Review of Systems   Review of Systems  Musculoskeletal: Positive for myalgias.  All other systems reviewed and are negative.   Physical Exam Updated Vital Signs BP 129/86   Pulse 73   Temp 97.7 F (36.5 C) (Oral)   Resp 13   Ht 5\' 11"  (1.803 m)   Wt 83 kg   SpO2 99%   BMI 25.52 kg/m   Physical Exam Vitals and nursing note reviewed.  HENT:     Head: Normocephalic.     Nose: Nose  normal.     Mouth/Throat:     Mouth: Mucous membranes are dry.  Eyes:     Extraocular Movements: Extraocular movements intact.     Pupils: Pupils are equal, round, and reactive to light.  Cardiovascular:     Rate and Rhythm: Normal rate and regular rhythm.     Pulses: Normal pulses.     Heart sounds: Normal heart sounds.  Pulmonary:     Effort: Pulmonary effort is normal.     Breath sounds: Normal breath sounds.  Abdominal:     General: Abdomen is flat.     Palpations: Abdomen is soft.  Musculoskeletal:        General: Normal range of motion.     Cervical back: Normal range of motion.     Comments: Mild diffuse tenderness on all muscles but no particular calf tenderness.  Skin:    General: Skin is warm.     Capillary Refill: Capillary refill takes less than 2 seconds.  Neurological:     General: No focal deficit present.     Mental Status: He is alert and oriented to person, place, and time.  Psychiatric:        Mood and Affect: Mood normal.  Behavior: Behavior normal.     ED Results / Procedures / Treatments   Labs (all labs ordered are listed, but only abnormal results are displayed) Labs Reviewed  COMPREHENSIVE METABOLIC PANEL - Abnormal; Notable for the following components:      Result Value   Sodium 134 (*)    CO2 21 (*)    Glucose, Bld 128 (*)    Creatinine, Ser 1.49 (*)    GFR calc non Af Amer 51 (*)    GFR calc Af Amer 59 (*)    All other components within normal limits  URINALYSIS, ROUTINE W REFLEX MICROSCOPIC - Abnormal; Notable for the following components:   Color, Urine COLORLESS (*)    Specific Gravity, Urine 1.002 (*)    Hgb urine dipstick SMALL (*)    All other components within normal limits  CBC WITH DIFFERENTIAL/PLATELET  CK  RAPID URINE DRUG SCREEN, HOSP PERFORMED    EKG None  Radiology No results found.  Procedures Procedures (including critical care time)  Medications Ordered in ED Medications  sodium chloride 0.9 % bolus  1,000 mL (1,000 mLs Intravenous New Bag/Given 05/10/20 2204)  diazepam (VALIUM) injection 5 mg (5 mg Intravenous Given 05/10/20 2207)    ED Course  I have reviewed the triage vital signs and the nursing notes.  Pertinent labs & imaging results that were available during my care of the patient were reviewed by me and considered in my medical decision making (see chart for details).    MDM Rules/Calculators/A&P                         Manuel Clark is a 59 y.o. male presenting with diffuse muscle pain.  Patient is afebrile.  Consider some dehydration with some muscle tenderness versus rhabdomyolysis.  Plan to check basic blood work as well as CK level and hydrate patient and give muscle relaxants.  11:09 PM Cr is 1.5, slightly elevated. CK normal. Given IVF and valium and felt better. States that he works out a lot and didn't drink enough fluids. Told him to stay hydrated after working out. Stable for discharge.    Final Clinical Impression(s) / ED Diagnoses Final diagnoses:  None    Rx / DC Orders ED Discharge Orders    None       Charlynne Pander, MD 05/10/20 2309

## 2022-05-22 ENCOUNTER — Other Ambulatory Visit: Payer: Self-pay

## 2022-05-22 ENCOUNTER — Encounter (HOSPITAL_COMMUNITY): Payer: Self-pay | Admitting: Emergency Medicine

## 2022-05-22 ENCOUNTER — Emergency Department (HOSPITAL_COMMUNITY)
Admission: EM | Admit: 2022-05-22 | Discharge: 2022-05-22 | Discharge status: Against Medical Advice | Payer: No Typology Code available for payment source | Attending: Emergency Medicine | Admitting: Emergency Medicine

## 2022-05-22 DIAGNOSIS — Z5321 Procedure and treatment not carried out due to patient leaving prior to being seen by health care provider: Secondary | ICD-10-CM | POA: Insufficient documentation

## 2022-05-22 DIAGNOSIS — R1084 Generalized abdominal pain: Secondary | ICD-10-CM | POA: Diagnosis present

## 2022-05-22 LAB — LIPASE, BLOOD: Lipase: 39 U/L (ref 11–51)

## 2022-05-22 LAB — COMPREHENSIVE METABOLIC PANEL
ALT: 23 U/L (ref 0–44)
AST: 30 U/L (ref 15–41)
Albumin: 3.8 g/dL (ref 3.5–5.0)
Alkaline Phosphatase: 33 U/L — ABNORMAL LOW (ref 38–126)
Anion gap: 8 (ref 5–15)
BUN: 15 mg/dL (ref 6–20)
CO2: 21 mmol/L — ABNORMAL LOW (ref 22–32)
Calcium: 9.3 mg/dL (ref 8.9–10.3)
Chloride: 109 mmol/L (ref 98–111)
Creatinine, Ser: 1.03 mg/dL (ref 0.61–1.24)
GFR, Estimated: >60 mL/min (ref >60)
Glucose, Bld: 101 mg/dL — ABNORMAL HIGH (ref 70–99)
Potassium: 3.9 mmol/L (ref 3.5–5.1)
Sodium: 138 mmol/L (ref 135–145)
Total Bilirubin: 0.4 mg/dL (ref 0.3–1.2)
Total Protein: 6.9 g/dL (ref 6.5–8.1)

## 2022-05-22 LAB — URINALYSIS, ROUTINE W REFLEX MICROSCOPIC
Bilirubin Urine: NEGATIVE
Glucose, UA: NEGATIVE mg/dL
Hgb urine dipstick: NEGATIVE
Ketones, ur: NEGATIVE mg/dL
Leukocytes,Ua: NEGATIVE
Nitrite: NEGATIVE
Protein, ur: NEGATIVE mg/dL
Specific Gravity, Urine: 1.012 (ref 1.005–1.030)
pH: 5 (ref 5.0–8.0)

## 2022-05-22 LAB — CBC
HCT: 42 % (ref 39.0–52.0)
Hemoglobin: 14 g/dL (ref 13.0–17.0)
MCH: 27.7 pg (ref 26.0–34.0)
MCHC: 33.3 g/dL (ref 30.0–36.0)
MCV: 83 fL (ref 80.0–100.0)
Platelets: 310 10*3/uL (ref 150–400)
RBC: 5.06 MIL/uL (ref 4.22–5.81)
RDW: 14.1 % (ref 11.5–15.5)
WBC: 6.4 10*3/uL (ref 4.0–10.5)
nRBC: 0 % (ref 0.0–0.2)

## 2022-05-22 NOTE — ED Triage Notes (Signed)
Pt c/o right sided abdominal cramping after profusely sweating. Denies nausea/vomiting/diarrhea or urinary symptoms.

## 2022-05-22 NOTE — ED Notes (Addendum)
Pt spotted walking towards the exit and when asked where he was going he stated was leaving and going to another doctors office because this was taking too long. Pt told it is encouraged to stay and be seen by a doctor. Pt ambulatory and denies pain

## 2022-11-05 ENCOUNTER — Ambulatory Visit (HOSPITAL_COMMUNITY)
Admission: EM | Admit: 2022-11-05 | Discharge: 2022-11-05 | Disposition: A | Discharge status: Home / Self Care | Payer: No Typology Code available for payment source | Attending: Nurse Practitioner | Admitting: Nurse Practitioner

## 2022-11-05 ENCOUNTER — Encounter (HOSPITAL_COMMUNITY): Payer: Self-pay

## 2022-11-05 DIAGNOSIS — S39011A Strain of muscle, fascia and tendon of abdomen, initial encounter: Secondary | ICD-10-CM

## 2022-11-05 MED ORDER — TIZANIDINE HCL 4 MG PO TABS: 4.0000 mg | ORAL_TABLET | Freq: Three times a day (TID) | ORAL | 0 refills | Status: AC | PRN

## 2022-11-05 NOTE — ED Provider Notes (Signed)
[facility]    CSN: [id number] Arrival date & time: 11/05/22  1258      History   Chief Complaint Chief Complaint  Patient presents with   Cramps    HPI Manuel Clark is a 62 y.o. male.   Patient presents today for 1 day history of bilateral side cramps.  Reports at the gym yesterday, he had a very intense workout and has not been drinking as much fluids as he should.  Reports he tries to drink 2 - 24 ounces of water daily.  Reports this has happened to him in the past and he has required muscle relaxants and IV fluids for dehydration.  Patient denies abdominal pain, nausea/vomiting, fever, cough, congestion, sore throat, diarrhea, or constipation.  Reports appetite has been normal past few days.  No recent changes in medication or new supplements.    History reviewed. No pertinent past medical history.  Patient Active Problem List   Diagnosis Date Noted   Bilateral low back pain with bilateral sciatica 07/04/2018    Past Surgical History:  Procedure Laterality Date   APPENDECTOMY         Home Medications    Prior to Admission medications   Medication Sig Start Date End Date Taking? Authorizing Provider  tiZANidine (ZANAFLEX) 4 MG tablet Take 1 tablet (4 mg total) by mouth every 8 (eight) hours as needed for muscle spasms. Do not take with alcohol or while driving or operating heavy machinery.  May cause drowsiness. 11/05/22  Yes Noemi Chapel A, NP  hydrochlorothiazide (HYDRODIURIL) 25 MG tablet Take 1 tablet (25 mg total) by mouth daily. 07/18/18   Scot Jun, NP    Family History History reviewed. No pertinent family history.  Social History Social History   Tobacco Use   Smoking status: Every Day    Packs/day: .5    Types: Cigarettes   Smokeless tobacco: Never  Substance Use Topics   Alcohol use: Yes    Comment: rarely   Drug use: Yes    Types: Marijuana     Allergies   Patient has no known allergies.   Review of  Systems Review of Systems Per HPI  Physical Exam Triage Vital Signs ED Triage Vitals [11/05/22 1437]  Enc Vitals Group     BP (!) 146/91     Pulse Rate 69     Resp 18     Temp 98.9 F (37.2 C)     Temp Source Oral     SpO2 97 %     Weight      Height      Head Circumference      Peak Flow      Pain Score 10     Pain Loc      Pain Edu?      Excl. in [place]?    No data found.  Updated Vital Signs BP (!) 146/91 (BP Location: Right Arm)   Pulse 69   Temp 98.9 F (37.2 C) (Oral)   Resp 18   SpO2 97%   Visual Acuity Right Eye Distance:   Left Eye Distance:   Bilateral Distance:    Right Eye Near:   Left Eye Near:    Bilateral Near:     Physical Exam Vitals and nursing note reviewed.  Constitutional:      General: He is not in acute distress.    Appearance: Normal appearance. He is not toxic-appearing.  HENT:     Mouth/Throat:  Mouth: Mucous membranes are moist.     Pharynx: Oropharynx is clear. No oropharyngeal exudate or posterior oropharyngeal erythema.  Eyes:     General: No scleral icterus.       Right eye: No discharge.        Left eye: No discharge.     Extraocular Movements: Extraocular movements intact.     Pupils: Pupils are equal, round, and reactive to light.  Pulmonary:     Effort: Pulmonary effort is normal. No respiratory distress.  Abdominal:     General: Abdomen is flat. Bowel sounds are normal. There is no distension.     Palpations: Abdomen is soft. There is no mass.     Tenderness: There is no abdominal tenderness. There is no right CVA tenderness, left CVA tenderness, guarding or rebound.       Comments: Patient exquisitely tender to palpation in areas marked; obvious deformities, redness, swelling, or bruising  Skin:    General: Skin is warm and dry.     Capillary Refill: Capillary refill takes less than 2 seconds.     Coloration: Skin is not jaundiced or pale.     Findings: No erythema.  Neurological:     Mental Status: He is  alert and oriented to person, place, and time.  Psychiatric:        Behavior: Behavior is cooperative.      UC Treatments / Results  Labs (all labs ordered are listed, but only abnormal results are displayed) Labs Reviewed  COMPREHENSIVE METABOLIC PANEL    EKG   Radiology No results found.  Procedures Procedures (including critical care time)  Medications Ordered in UC Medications - No data to display  Initial Impression / Assessment and Plan / UC Course  I have reviewed the triage vital signs and the nursing notes.  Pertinent labs & imaging results that were available during my care of the patient were reviewed by me and considered in my medical decision making (see chart for details).   Patient is well-appearing, normotensive, afebrile, not tachycardic, not tachypneic, oxygenating well on room air.    1. Strain of abdominal muscle, initial encounter Noted flags in history or on exam today Vital signs are reassuring Low suspicion for rhabdomyolysis Will check electrolytes with kidney function Recommended increasing hydration with water, sugar-free electrolyte drinks Can take tizanidine every 8 hours as needed for muscular pain Seek care if symptoms persist or worsen spite treatment  The patient was given the opportunity to ask questions.  All questions answered to their satisfaction.  The patient is in agreement to this plan.    Final Clinical Impressions(s) / UC Diagnoses   Final diagnoses:  Strain of abdominal muscle, initial encounter     Discharge Instructions      As we discussed, it sounds like you may have strained the abdominal muscles on your sides Start stretches and exercises to help with the pain You can take Tylenol [phone] mg every 6 hours as needed for pain alternating with tizanidine every 8 hours  We will call you if the blood work comes back abnormal Make sure you are drinking plenty of fluids with electrolytes Follow up with PCP if  symptoms persist or worsen despite treatment    ED Prescriptions     Medication Sig Dispense Auth. Provider   tiZANidine (ZANAFLEX) 4 MG tablet Take 1 tablet (4 mg total) by mouth every 8 (eight) hours as needed for muscle spasms. Do not take with alcohol or while driving or  operating heavy machinery.  May cause drowsiness. 30 tablet Eulogio Bear, NP      PDMP not reviewed this encounter.   Eulogio Bear, NP 11/05/22 [phone]

## 2022-11-05 NOTE — ED Triage Notes (Signed)
Patient with c/o abdominal cramps for a "few days". States this has happened before, and he was given something for dehydration. Denies urinary or bowel symptoms.

## 2022-11-05 NOTE — Discharge Instructions (Signed)
As we discussed, it sounds like you may have strained the abdominal muscles on your sides Start stretches and exercises to help with the pain You can take Tylenol [phone] mg every 6 hours as needed for pain alternating with tizanidine every 8 hours  We will call you if the blood work comes back abnormal Make sure you are drinking plenty of fluids with electrolytes Follow up with PCP if symptoms persist or worsen despite treatment

## 2022-11-29 ENCOUNTER — Encounter (HOSPITAL_COMMUNITY): Payer: Self-pay

## 2022-11-29 ENCOUNTER — Other Ambulatory Visit: Payer: Self-pay

## 2022-11-29 ENCOUNTER — Emergency Department (HOSPITAL_COMMUNITY): Payer: No Typology Code available for payment source

## 2022-11-29 ENCOUNTER — Emergency Department (HOSPITAL_COMMUNITY)
Admission: EM | Admit: 2022-11-29 | Discharge: 2022-11-29 | Disposition: A | Discharge status: Home / Self Care | Payer: No Typology Code available for payment source | Attending: Emergency Medicine | Admitting: Emergency Medicine

## 2022-11-29 DIAGNOSIS — I10 Essential (primary) hypertension: Secondary | ICD-10-CM | POA: Insufficient documentation

## 2022-11-29 DIAGNOSIS — R0782 Intercostal pain: Secondary | ICD-10-CM | POA: Insufficient documentation

## 2022-11-29 DIAGNOSIS — Z79899 Other long term (current) drug therapy: Secondary | ICD-10-CM | POA: Insufficient documentation

## 2022-11-29 DIAGNOSIS — R079 Chest pain, unspecified: Secondary | ICD-10-CM | POA: Diagnosis present

## 2022-11-29 LAB — BASIC METABOLIC PANEL
Anion gap: 14 (ref 5–15)
BUN: 16 mg/dL (ref 6–20)
CO2: 21 mmol/L — ABNORMAL LOW (ref 22–32)
Calcium: 9.2 mg/dL (ref 8.9–10.3)
Chloride: 103 mmol/L (ref 98–111)
Creatinine, Ser: 0.99 mg/dL (ref 0.61–1.24)
GFR, Estimated: >60 mL/min (ref >60)
Glucose, Bld: 110 mg/dL — ABNORMAL HIGH (ref 70–99)
Potassium: 3.8 mmol/L (ref 3.5–5.1)
Sodium: 138 mmol/L (ref 135–145)

## 2022-11-29 LAB — CBC
HCT: 42.2 % (ref 39.0–52.0)
Hemoglobin: 13.8 g/dL (ref 13.0–17.0)
MCH: 27.9 pg (ref 26.0–34.0)
MCHC: 32.7 g/dL (ref 30.0–36.0)
MCV: 85.3 fL (ref 80.0–100.0)
Platelets: 261 10*3/uL (ref 150–400)
RBC: 4.95 MIL/uL (ref 4.22–5.81)
RDW: 13.9 % (ref 11.5–15.5)
WBC: 5.7 10*3/uL (ref 4.0–10.5)
nRBC: 0 % (ref 0.0–0.2)

## 2022-11-29 LAB — D-DIMER, QUANTITATIVE: D-Dimer, Quant: 0.32 ug/mL-FEU (ref 0.00–0.50)

## 2022-11-29 LAB — TROPONIN I (HIGH SENSITIVITY): Troponin I (High Sensitivity): 5 ng/L (ref <18)

## 2022-11-29 MED ORDER — HYDROCODONE-ACETAMINOPHEN 5-325 MG PO TABS
1.0000 | ORAL_TABLET | Freq: Once | ORAL | Status: AC
  Administered 2022-11-29: 1 via ORAL
  Filled 2022-11-29: qty 1

## 2022-11-29 MED ORDER — LIDOCAINE 5 % EX PTCH
1.0000 | MEDICATED_PATCH | CUTANEOUS | Status: DC
  Administered 2022-11-29: 1 via TRANSDERMAL
  Filled 2022-11-29: qty 1

## 2022-11-29 MED ORDER — BENZONATATE 100 MG PO CAPS
100.0000 mg | ORAL_CAPSULE | Freq: Once | ORAL | Status: AC
  Administered 2022-11-29: 100 mg via ORAL
  Filled 2022-11-29: qty 1

## 2022-11-29 NOTE — ED Provider Notes (Signed)
[place] EMERGENCY DEPARTMENT AT [facility] Provider Note   CSN: [id number] Arrival date & time: 11/29/22  1025     History  Chief Complaint  Patient presents with   Chest Pain    Manuel Clark is a 62 y.o. male.  62 year old male presents to the emergency department with right-sided chest pain.  Says that for the past 2 to 3 weeks has had atraumatic right rib pain.  Describes it as sharp and worsened with certain body movements and position.  Not exertional.  No diaphoresis, shortness of breath, fevers, or cough.  Though later did tell nurse that he may have been coughing recently.  No lower extremity swelling or history of DVT/PE.  No history of MI.  Says that he does workout and could potentially have strained a muscle from that or be dehydrated at this time.       Home Medications Prior to Admission medications   Medication Sig Start Date End Date Taking? Authorizing Provider  hydrochlorothiazide (HYDRODIURIL) 25 MG tablet Take 1 tablet (25 mg total) by mouth daily. 07/18/18   Bing Neighbors, NP  tiZANidine (ZANAFLEX) 4 MG tablet Take 1 tablet (4 mg total) by mouth every 8 (eight) hours as needed for muscle spasms. Do not take with alcohol or while driving or operating heavy machinery.  May cause drowsiness. 11/05/22   Valentino Nose, NP      Allergies    Patient has no known allergies.    Review of Systems   Review of Systems  Physical Exam Updated Vital Signs BP (!) 161/104 (BP Location: Right Arm)   Pulse 62   Temp 98.3 F (36.8 C) (Oral)   Resp 14   Ht 5\' 11"  (1.803 m)   Wt 79.4 kg   SpO2 99%   BMI 24.41 kg/m  Physical Exam Vitals and nursing note reviewed.  Constitutional:      General: He is not in acute distress.    Appearance: He is well-developed.  HENT:     Head: Normocephalic and atraumatic.     Right Ear: External ear normal.     Left Ear: External ear normal.     Nose: Nose normal.  Eyes:     Extraocular Movements:  Extraocular movements intact.     Conjunctiva/sclera: Conjunctivae normal.     Pupils: Pupils are equal, round, and reactive to light.  Cardiovascular:     Rate and Rhythm: Normal rate and regular rhythm.     Heart sounds: Normal heart sounds.     Comments: Chest pain not reproducible Pulmonary:     Effort: Pulmonary effort is normal. No respiratory distress.     Breath sounds: Normal breath sounds.  Musculoskeletal:     Cervical back: Normal range of motion and neck supple.     Right lower leg: No edema.     Left lower leg: No edema.  Skin:    General: Skin is warm and dry.  Neurological:     Mental Status: He is alert. Mental status is at baseline.  Psychiatric:        Mood and Affect: Mood normal.        Behavior: Behavior normal.     ED Results / Procedures / Treatments   Labs (all labs ordered are listed, but only abnormal results are displayed) Labs Reviewed  BASIC METABOLIC PANEL - Abnormal; Notable for the following components:      Result Value   CO2 21 (*)  Glucose, Bld 110 (*)    All other components within normal limits  CBC  D-DIMER, QUANTITATIVE  TROPONIN I (HIGH SENSITIVITY)    EKG EKG Interpretation  Date/Time:  Monday November 29 2022 07:17:45 EDT Ventricular Rate:  71 PR Interval:  133 QRS Duration: 102 QT Interval:  382 QTC Calculation: 416 R Axis:   -38 Text Interpretation: Sinus rhythm Left axis deviation Abnormal R-wave progression, early transition Confirmed by Vonita Moss [phone]) on 11/29/2022 8:33:10 AM  Radiology DG Chest 2 View  Result Date: 11/29/2022 CLINICAL DATA:  Right-sided chest pain. EXAM: CHEST - 2 VIEW COMPARISON:  11/05/2021 FINDINGS: The heart size and mediastinal contours are within normal limits. Both lungs are clear. The visualized skeletal structures are unremarkable. IMPRESSION: No active cardiopulmonary disease. Electronically Signed   By: Signa Kell M.D.   On: 11/29/2022 07:58    Procedures Procedures    Medications Ordered in ED Medications  lidocaine (LIDODERM) 5 % 1 patch (1 patch Transdermal Patch Applied 11/29/22 0720)  HYDROcodone-acetaminophen (NORCO/VICODIN) 5-325 MG per tablet 1 tablet (1 tablet Oral Given 11/29/22 0719)  benzonatate (TESSALON) capsule 100 mg (100 mg Oral Given 11/29/22 0800)    ED Course/ Medical Decision Making/ A&P                             Medical Decision Making Amount and/or Complexity of Data Reviewed Labs: ordered. Radiology: ordered.  Risk Prescription drug management.   Manuel Clark is a 62 y.o. male with comorbidities that complicate the patient evaluation including hypertension who presents emergency department with chest pain that is sharp and pleuritic  Initial Ddx:  Musculoskeletal pain, rib fracture, PE, pneumonia, MI  MDM:  Feel the patient is likely having musculoskeletal pain especially since he says that he works out frequently and may have strained a muscle.  Given his age and unable to use the PE RC criteria so we will obtain D-dimer to evaluate for PE but feel this is less likely.  No infectious symptoms to suggest a pneumonia.  Pain would be atypical for MI but will obtain EKG and troponin.  Plan:  Labs Troponin D-dimer Chest x-ray EKG  ED Summary/Re-evaluation:  Patient underwent the above workup that did not show any acute abnormalities.  Did have a single troponin and given the duration of his symptoms do not feel that repeat this warranted at this time.  Pain had improved with Norco and lidocaine patch.  Instructed to take Tylenol, ibuprofen, and lidocaine patches at home and to follow-up with his primary doctor.  Do suspect that he has had a muscle strain.  Was also instructed to return to activity as tolerated to prevent any additional strains while lifting weights.  This patient presents to the ED for concern of complaints listed in HPI, this involves an extensive number of treatment options, and is a complaint that  carries with it a high risk of complications and morbidity. Disposition including potential need for admission considered.   Dispo: DC Home. Return precautions discussed including, but not limited to, those listed in the AVS. Allowed pt time to ask questions which were answered fully prior to dc.  Records reviewed Outpatient Clinic Notes The following labs were independently interpreted: Chemistry and show no acute abnormality I independently reviewed the following imaging with scope of interpretation limited to determining acute life threatening conditions related to emergency care: Chest x-ray and agree with the radiologist interpretation with the following  exceptions: none I personally reviewed and interpreted cardiac monitoring: normal sinus rhythm  I personally reviewed and interpreted the pt's EKG: see above for interpretation  I have reviewed the patients home medications and made adjustments as needed  Final Clinical Impression(s) / ED Diagnoses Final diagnoses:  Intercostal pain    Rx / DC Orders ED Discharge Orders     None         Rondel BatonPaterson, Tahja Liao C, MD 11/29/22 [phone]

## 2022-11-29 NOTE — ED Triage Notes (Signed)
Right sided rib pain. Pain Increased with inhalation and movement for 2-3 weeks. Pt states this has happened before.

## 2022-11-29 NOTE — ED Notes (Signed)
Pt stated he will take it easy on lifting weights and do light stretches at home.

## 2022-11-29 NOTE — ED Notes (Signed)
Patient transported to X-ray 

## 2022-11-29 NOTE — Discharge Instructions (Signed)
You were seen for your rib pain in the emergency department.  You had blood work and an x-ray and an EKG that was reassuring.  At home, please take Tylenol and ibuprofen for your pain.  You may also use over-the-counter lidocaine patches for your pain.    Follow-up with your primary doctor in 2-3 days regarding your visit.    Return immediately to the emergency department if you experience any of the following: Difficulty breathing, worsening pain, or any other concerning symptoms.    Thank you for visiting our Emergency Department. It was a pleasure taking care of you today.

## 2022-11-29 NOTE — ED Notes (Signed)
RN notified EDP about cough medication.   Pt states his right side rib pain intensifies with cough, talking for long periods of times, or randomly.  Pt denies injury, being sick, or hx of clots.

## 2024-01-28 ENCOUNTER — Emergency Department (HOSPITAL_COMMUNITY): Admission: EM | Admit: 2024-01-28 | Discharge: 2024-01-28 | Discharge status: Against Medical Advice

## 2024-01-28 NOTE — ED Notes (Signed)
Pt called x 3 for triage with no answer.  °

## 2024-01-28 NOTE — ED Notes (Addendum)
Pt called x1 for triage  

## 2024-01-28 NOTE — ED Notes (Signed)
Pt called x 2 for triage.

## 2024-05-20 ENCOUNTER — Other Ambulatory Visit: Payer: Self-pay

## 2024-05-20 ENCOUNTER — Encounter (HOSPITAL_COMMUNITY): Payer: Self-pay | Admitting: *Deleted

## 2024-05-20 ENCOUNTER — Ambulatory Visit (HOSPITAL_COMMUNITY)
Admission: EM | Admit: 2024-05-20 | Discharge: 2024-05-20 | Disposition: A | Discharge status: Home / Self Care | Attending: Physician Assistant | Admitting: Physician Assistant

## 2024-05-20 DIAGNOSIS — R42 Dizziness and giddiness: Secondary | ICD-10-CM | POA: Diagnosis not present

## 2024-05-20 DIAGNOSIS — I10 Essential (primary) hypertension: Secondary | ICD-10-CM | POA: Diagnosis not present

## 2024-05-20 MED ORDER — MECLIZINE HCL 25 MG PO TABS: 25.0000 mg | ORAL_TABLET | Freq: Three times a day (TID) | ORAL | 0 refills | Status: AC | PRN

## 2024-05-20 NOTE — Discharge Instructions (Addendum)
 Go to ED if symptoms fail to improve or worsen

## 2024-05-20 NOTE — ED Triage Notes (Signed)
 Pt reports he has felt dizzy for a week and a half. Pt reports today is worse. PT denies any N/V/D. Pt reports loss of appetite that started this morning.

## 2024-05-20 NOTE — ED Provider Notes (Signed)
 [facility]    CSN: [id number] Arrival date & time: 05/20/24  1135      History   Chief Complaint Chief Complaint  Patient presents with   Dizziness    HPI Manuel Clark is a 63 y.o. male.   Patient here concerned with dizziness x 1.5 weeks, worse this morning.  No head injury, fall, or trauma.  Worse with movement, better with rest. Denies vision changes, chest pain, SOB, palpitations, LE edema, LE pain, cough, wheezing.  He reports he never started blood pressure medication - he didn't know he had prescription for it.    History reviewed. No pertinent past medical history.  Patient Active Problem List   Diagnosis Date Noted   Bilateral low back pain with bilateral sciatica 07/04/2018    Past Surgical History:  Procedure Laterality Date   APPENDECTOMY         Home Medications    Prior to Admission medications   Medication Sig Start Date End Date Taking? Authorizing Provider  meclizine (ANTIVERT) 25 MG tablet Take 1 tablet (25 mg total) by mouth 3 (three) times daily as needed for dizziness. 05/20/24  Yes Juleen Rush, PA-C  hydrochlorothiazide  (HYDRODIURIL ) 25 MG tablet Take 1 tablet (25 mg total) by mouth daily. 07/18/18   Arloa Suzen RAMAN, NP  tiZANidine  (ZANAFLEX ) 4 MG tablet Take 1 tablet (4 mg total) by mouth every 8 (eight) hours as needed for muscle spasms. Do not take with alcohol or while driving or operating heavy machinery.  May cause drowsiness. 11/05/22   Chandra Harlene LABOR, NP    Family History History reviewed. No pertinent family history.  Social History Social History   Tobacco Use   Smoking status: Every Day    Current packs/day: 0.50    Types: Cigarettes   Smokeless tobacco: Never  Substance Use Topics   Alcohol use: Yes    Comment: rarely   Drug use: Yes    Types: Marijuana     Allergies   Patient has no known allergies.   Review of Systems Review of Systems  Constitutional:  Negative for chills, fatigue and  fever.  Eyes:  Negative for photophobia and visual disturbance.  Respiratory:  Negative for cough, shortness of breath and wheezing.   Cardiovascular:  Negative for chest pain, palpitations and leg swelling.  Gastrointestinal:  Negative for diarrhea, nausea and vomiting.  Musculoskeletal:  Negative for arthralgias and myalgias.  Skin:  Negative for color change.  Neurological:  Positive for light-headedness. Negative for seizures, syncope, speech difficulty, weakness, numbness and headaches.  Hematological:  Negative for adenopathy. Does not bruise/bleed easily.  Psychiatric/Behavioral:  Negative for sleep disturbance.      Physical Exam Triage Vital Signs ED Triage Vitals  Encounter Vitals Group     BP 05/20/24 1306 (!) 199/91     Girls Systolic BP Percentile --      Girls Diastolic BP Percentile --      Boys Systolic BP Percentile --      Boys Diastolic BP Percentile --      Pulse Rate 05/20/24 1306 68     Resp 05/20/24 1306 18     Temp 05/20/24 1306 98.4 F (36.9 C)     Temp src --      SpO2 05/20/24 1306 98 %     Weight --      Height --      Head Circumference --      Peak Flow --  Pain Score 05/20/24 1303 0     Pain Loc --      Pain Education --      Exclude from Growth Chart --    No data found.  Updated Vital Signs BP (!) 199/91   Pulse 68   Temp 98.4 F (36.9 C)   Resp 18   SpO2 98%   Visual Acuity Right Eye Distance:   Left Eye Distance:   Bilateral Distance:    Right Eye Near:   Left Eye Near:    Bilateral Near:     Physical Exam Vitals and nursing note reviewed.  Constitutional:      General: He is not in acute distress.    Appearance: Normal appearance. He is not ill-appearing.  HENT:     Head: Normocephalic and atraumatic.     Right Ear: Ear canal normal. There is no impacted cerumen.     Left Ear: Tympanic membrane and ear canal normal. There is no impacted cerumen.     Nose: No congestion or rhinorrhea.  Eyes:     General: No  scleral icterus.    Extraocular Movements: Extraocular movements intact.     Right eye: Normal extraocular motion and no nystagmus.     Left eye: Normal extraocular motion and no nystagmus.     Conjunctiva/sclera: Conjunctivae normal.     Pupils: Pupils are equal, round, and reactive to light.     Funduscopic exam:    Right eye: No papilledema.        Left eye: No papilledema.     Slit lamp exam:    Right eye: No photophobia.     Left eye: No photophobia.  Cardiovascular:     Rate and Rhythm: Normal rate. Rhythm irregular. No extrasystoles are present.    Heart sounds: Normal heart sounds. No murmur heard. Pulmonary:     Effort: Pulmonary effort is normal. No respiratory distress.  Musculoskeletal:        General: Normal range of motion.     Cervical back: Normal range of motion. No rigidity.     Right lower leg: No swelling or tenderness. No edema.     Left lower leg: No swelling or tenderness. No edema.  Lymphadenopathy:     Cervical: No cervical adenopathy.  Skin:    General: Skin is warm.     Coloration: Skin is not jaundiced.     Findings: No rash.  Neurological:     General: No focal deficit present.     Mental Status: He is alert and oriented to person, place, and time.     GCS: GCS eye subscore is 4. GCS verbal subscore is 5. GCS motor subscore is 6.     Cranial Nerves: Cranial nerves 2-12 are intact. No dysarthria.     Motor: No weakness, tremor, abnormal muscle tone or pronator drift.     Coordination: Coordination normal.     Gait: Gait and tandem walk normal.     Deep Tendon Reflexes:     Reflex Scores:      Patellar reflexes are 2+ on the right side and 2+ on the left side. Psychiatric:        Mood and Affect: Mood normal.        Behavior: Behavior normal.      UC Treatments / Results  Labs (all labs ordered are listed, but only abnormal results are displayed) Labs Reviewed - No data to display  EKG  EKG: unchanged from previous tracings, sinus  arrhythmia, LAFB, non specific ST changes.    Radiology No results found.  Procedures Procedures (including critical care time)  Medications Ordered in UC Medications - No data to display  Initial Impression / Assessment and Plan / UC Course  I have reviewed the triage vital signs and the nursing notes.  Pertinent labs & imaging results that were available during my care of the patient were reviewed by me and considered in my medical decision making (see chart for details).  Clinical Course as of 05/20/24 1438  Sun May 20, 2024  1437 BP(!): 199/91 [JL]    Clinical Course User Index [JL] Juleen Rush, PA-C    No focal neurological deficits Elevated blood pressure, repeat blood pressure 167/103 Started meclizine Strict ED precautions provided ECG w/o acute abnormalities Follow up with PCP to monitor blood pressure Final Clinical Impressions(s) / UC Diagnoses   Final diagnoses:  Dizziness  Hypertension, unspecified type     Discharge Instructions      Go to ED if symptoms fail to improve or worsen     ED Prescriptions     Medication Sig Dispense Auth. Provider   meclizine (ANTIVERT) 25 MG tablet Take 1 tablet (25 mg total) by mouth 3 (three) times daily as needed for dizziness. 30 tablet Juleen Rush, PA-C      PDMP not reviewed this encounter.   Juleen Rush, PA-C 05/20/24 1438
